# Patient Record
Sex: Female | Born: 1990 | Race: Black or African American | Hispanic: No | Marital: Single | Smoking: Never smoker
Health system: Southern US, Community
[De-identification: ages and names within clinical notes are randomized; demographics above are authoritative.]

## PROBLEM LIST (undated history)

## (undated) ENCOUNTER — Ambulatory Visit (HOSPITAL_COMMUNITY): Admission: EM | Payer: Self-pay | Source: Home / Self Care

---
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
## 2013-04-29 DIAGNOSIS — Z3202 Encounter for pregnancy test, result negative: Secondary | ICD-10-CM | POA: Insufficient documentation

## 2013-04-29 DIAGNOSIS — Z79899 Other long term (current) drug therapy: Secondary | ICD-10-CM | POA: Insufficient documentation

## 2013-04-29 DIAGNOSIS — R1011 Right upper quadrant pain: Secondary | ICD-10-CM | POA: Insufficient documentation

## 2013-04-29 NOTE — ED Notes (Signed)
Right lower abd pain with vomiting and diarrhea. Pt also states she has had a cold and at times feels sob

## 2013-04-30 ENCOUNTER — Encounter (HOSPITAL_COMMUNITY): Payer: Self-pay | Admitting: Emergency Medicine

## 2013-04-30 ENCOUNTER — Ambulatory Visit (HOSPITAL_COMMUNITY)
Admit: 2013-04-30 | Discharge: 2013-04-30 | Disposition: A | Discharge status: Home / Self Care | Payer: BC Managed Care – PPO | Attending: Emergency Medicine | Admitting: Emergency Medicine

## 2013-04-30 ENCOUNTER — Emergency Department (HOSPITAL_COMMUNITY)
Admission: EM | Admit: 2013-04-30 | Discharge: 2013-04-30 | Disposition: A | Discharge status: Home / Self Care | Payer: BC Managed Care – PPO | Attending: Emergency Medicine | Admitting: Emergency Medicine

## 2013-04-30 ENCOUNTER — Other Ambulatory Visit (HOSPITAL_COMMUNITY): Payer: Self-pay | Admitting: Emergency Medicine

## 2013-04-30 DIAGNOSIS — R1011 Right upper quadrant pain: Secondary | ICD-10-CM | POA: Insufficient documentation

## 2013-04-30 LAB — COMPREHENSIVE METABOLIC PANEL
ALBUMIN: 3.9 g/dL (ref 3.5–5.2)
ALT: 14 U/L (ref 0–35)
AST: 18 U/L (ref 0–37)
Alkaline Phosphatase: 82 U/L (ref 39–117)
BILIRUBIN TOTAL: 0.3 mg/dL (ref 0.3–1.2)
BUN: 10 mg/dL (ref 6–23)
CHLORIDE: 100 meq/L (ref 96–112)
CO2: 26 meq/L (ref 19–32)
Calcium: 9.3 mg/dL (ref 8.4–10.5)
Creatinine, Ser: 0.63 mg/dL (ref 0.50–1.10)
GFR calc Af Amer: >90 mL/min (ref >90)
Glucose, Bld: 98 mg/dL (ref 70–99)
POTASSIUM: 4.3 meq/L (ref 3.7–5.3)
Sodium: 136 mEq/L — ABNORMAL LOW (ref 137–147)
Total Protein: 8.2 g/dL (ref 6.0–8.3)

## 2013-04-30 LAB — CBC WITH DIFFERENTIAL/PLATELET
BASOS PCT: 0 % (ref 0–1)
Basophils Absolute: 0 10*3/uL (ref 0.0–0.1)
EOS PCT: 1 % (ref 0–5)
Eosinophils Absolute: 0.1 10*3/uL (ref 0.0–0.7)
HCT: 39 % (ref 36.0–46.0)
HEMOGLOBIN: 13.3 g/dL (ref 12.0–15.0)
LYMPHS ABS: 2.4 10*3/uL (ref 0.7–4.0)
LYMPHS PCT: 27 % (ref 12–46)
MCH: 29.9 pg (ref 26.0–34.0)
MCHC: 34.1 g/dL (ref 30.0–36.0)
MCV: 87.6 fL (ref 78.0–100.0)
Monocytes Absolute: 0.6 10*3/uL (ref 0.1–1.0)
Monocytes Relative: 7 % (ref 3–12)
Neutro Abs: 6 10*3/uL (ref 1.7–7.7)
Neutrophils Relative %: 66 % (ref 43–77)
PLATELETS: 279 10*3/uL (ref 150–400)
RBC: 4.45 MIL/uL (ref 3.87–5.11)
RDW: 12 % (ref 11.5–15.5)
WBC: 9 10*3/uL (ref 4.0–10.5)

## 2013-04-30 LAB — URINALYSIS, ROUTINE W REFLEX MICROSCOPIC
Bilirubin Urine: NEGATIVE
GLUCOSE, UA: NEGATIVE mg/dL
Hgb urine dipstick: NEGATIVE
Ketones, ur: NEGATIVE mg/dL
LEUKOCYTES UA: NEGATIVE
NITRITE: NEGATIVE
Protein, ur: NEGATIVE mg/dL
SPECIFIC GRAVITY, URINE: 1.015 (ref 1.005–1.030)
UROBILINOGEN UA: 1 mg/dL (ref 0.0–1.0)
pH: 8.5 — ABNORMAL HIGH (ref 5.0–8.0)

## 2013-04-30 LAB — PREGNANCY, URINE: Preg Test, Ur: NEGATIVE

## 2013-04-30 LAB — LIPASE, BLOOD: Lipase: 18 U/L (ref 11–59)

## 2013-04-30 MED ORDER — PANTOPRAZOLE SODIUM 40 MG IV SOLR
40.0000 mg | Freq: Once | INTRAVENOUS | Status: AC
  Administered 2013-04-30: 40 mg via INTRAVENOUS
  Filled 2013-04-30: qty 40

## 2013-04-30 MED ORDER — ONDANSETRON HCL 4 MG/2ML IJ SOLN
4.0000 mg | Freq: Once | INTRAMUSCULAR | Status: AC
  Administered 2013-04-30: 4 mg via INTRAVENOUS
  Filled 2013-04-30: qty 2

## 2013-04-30 MED ORDER — HYDROCODONE-ACETAMINOPHEN 5-325 MG PO TABS: 1.0000 | ORAL_TABLET | ORAL | Status: AC | PRN

## 2013-04-30 MED ORDER — SODIUM CHLORIDE 0.9 % IV SOLN
INTRAVENOUS | Status: DC
  Administered 2013-04-30: 02:00:00 via INTRAVENOUS

## 2013-04-30 MED ORDER — FENTANYL CITRATE 0.05 MG/ML IJ SOLN
100.0000 ug | Freq: Once | INTRAMUSCULAR | Status: AC
  Administered 2013-04-30: 100 ug via INTRAVENOUS
  Filled 2013-04-30: qty 2

## 2013-04-30 NOTE — Discharge Instructions (Signed)
Abdominal Pain, Adult °Many things can cause abdominal pain. Usually, abdominal pain is not caused by a disease and will improve without treatment. It can often be observed and treated at home. Your health care provider will do a physical exam and possibly order blood tests and X-rays to help determine the seriousness of your pain. However, in many cases, more time must pass before a clear cause of the pain can be found. Before that point, your health care provider may not know if you need more testing or further treatment. °HOME CARE INSTRUCTIONS  °Monitor your abdominal pain for any changes. The following actions may help to alleviate any discomfort you are experiencing: °· Only take over-the-counter or prescription medicines as directed by your health care provider. °· Do not take laxatives unless directed to do so by your health care provider. °· Try a clear liquid diet (broth, tea, or water) as directed by your health care provider. Slowly move to a bland diet as tolerated. °SEEK MEDICAL CARE IF: °· You have unexplained abdominal pain. °· You have abdominal pain associated with nausea or diarrhea. °· You have pain when you urinate or have a bowel movement. °· You experience abdominal pain that wakes you in the night. °· You have abdominal pain that is worsened or improved by eating food. °· You have abdominal pain that is worsened with eating fatty foods. °SEEK IMMEDIATE MEDICAL CARE IF:  °· Your pain does not go away within 2 hours. °· You have a fever. °· You keep throwing up (vomiting). °· Your pain is felt only in portions of the abdomen, such as the right side or the left lower portion of the abdomen. °· You pass bloody or black tarry stools. °MAKE SURE YOU: °· Understand these instructions.   °· Will watch your condition.   °· Will get help right away if you are not doing well or get worse.   °Document Released: 11/18/2004 Document Revised: 11/29/2012 Document Reviewed: 10/18/2012 °ExitCare® Patient  Information ©2014 [employer]. ° °

## 2013-04-30 NOTE — ED Provider Notes (Signed)
CSN: [id number]     Arrival date & time 04/29/13  2332 History   First MD Initiated Contact with Patient 04/30/13 0127     Chief Complaint  Patient presents with  . Abdominal Pain     (Consider location/radiation/quality/duration/timing/severity/associated sxs/prior Treatment) HPI This is a 23 year old female with right upper quadrant abdominal pain that began yesterday about noon. The onset has been gradual. The pain is now moderate to severe and is making her feel short of breath. Pain is worse with movement, palpation or deep breathing. She denies fever or chills. She denies nausea or vomiting. She did have diarrhea yesterday morning. She denies urinary symptoms, vaginal bleeding or discharge. She ate dinner at 5PM which did not change her pain.  No past medical history on file. No past surgical history on file. No family history on file. History  Substance Use Topics  . Smoking status: Never Smoker   . Smokeless tobacco: Not on file  . Alcohol Use: No   OB History   Grav Para Term Preterm Abortions TAB SAB Ect Mult Living                 Review of Systems  All other systems reviewed and are negative.   Allergies  Peanuts  Home Medications   Current Outpatient Rx  Name  Route  Sig  Dispense  Refill  . Multiple Vitamin (MULTIVITAMIN) tablet   Oral   Take 1 tablet by mouth daily.         . sertraline (ZOLOFT) 25 MG tablet   Oral   Take 25 mg by mouth daily.          BP 125/70  Pulse 102  Temp(Src) 97.9 F (36.6 C) (Oral)  Resp 18  Ht 5\' 7"  (1.702 m)  Wt 170 lb (77.111 kg)  BMI 26.62 kg/m2  SpO2 100%  LMP 04/05/2013  Physical Exam General: Well-developed, well-nourished female in no acute distress; appearance consistent with age of record HENT: normocephalic; atraumatic Eyes: pupils equal, round and reactive to light; extraocular muscles intact Neck: supple Heart: regular rate and rhythm; no murmurs, rubs or gallops Lungs: clear to auscultation  bilaterally; shallow breaths due to abdominal pain Abdomen: soft; nondistended; right upper quadrant tenderness; no masses or hepatosplenomegaly; bowel sounds present; gallbladder not visualized on bedside ultrasound Extremities: No deformity; full range of motion; pulses normal Neurologic: Awake, alert and oriented; motor function intact in all extremities and symmetric; no facial droop Skin: Warm and dry Psychiatric: Flat affect    ED Course  Procedures (including critical care time)   MDM   Nursing notes and vitals signs, including pulse oximetry, reviewed.  Summary of this visit's results, reviewed by myself:  Labs:  Results for orders placed during the hospital encounter of 04/30/13 (from the past 24 hour(s))  URINALYSIS, ROUTINE W REFLEX MICROSCOPIC     Status: Abnormal   Collection Time    04/30/13 12:13 AM      Result Value Ref Range   Color, Urine YELLOW  YELLOW   APPearance CLEAR  CLEAR   Specific Gravity, Urine 1.015  1.005 - 1.030   pH 8.5 (*) 5.0 - 8.0   Glucose, UA NEGATIVE  NEGATIVE mg/dL   Hgb urine dipstick NEGATIVE  NEGATIVE   Bilirubin Urine NEGATIVE  NEGATIVE   Ketones, ur NEGATIVE  NEGATIVE mg/dL   Protein, ur NEGATIVE  NEGATIVE mg/dL   Urobilinogen, UA 1.0  0.0 - 1.0 mg/dL   Nitrite NEGATIVE  NEGATIVE  Leukocytes, UA NEGATIVE  NEGATIVE  PREGNANCY, URINE     Status: None   Collection Time    04/30/13 12:13 AM      Result Value Ref Range   Preg Test, Ur NEGATIVE  NEGATIVE  COMPREHENSIVE METABOLIC PANEL     Status: Abnormal   Collection Time    04/30/13  2:10 AM      Result Value Ref Range   Sodium 136 (*) 137 - 147 mEq/L   Potassium 4.3  3.7 - 5.3 mEq/L   Chloride 100  96 - 112 mEq/L   CO2 26  19 - 32 mEq/L   Glucose, Bld 98  70 - 99 mg/dL   BUN 10  6 - 23 mg/dL   Creatinine, Ser 4.090.63  0.50 - 1.10 mg/dL   Calcium 9.3  8.4 - 81.110.5 mg/dL   Total Protein 8.2  6.0 - 8.3 g/dL   Albumin 3.9  3.5 - 5.2 g/dL   AST 18  0 - 37 U/L   ALT 14  0 - 35  U/L   Alkaline Phosphatase 82  39 - 117 U/L   Total Bilirubin 0.3  0.3 - 1.2 mg/dL   GFR calc non Af Amer >90  >90 mL/min   GFR calc Af Amer >90  >90 mL/min  LIPASE, BLOOD     Status: None   Collection Time    04/30/13  2:10 AM      Result Value Ref Range   Lipase 18  11 - 59 U/L  CBC WITH DIFFERENTIAL     Status: None   Collection Time    04/30/13  2:10 AM      Result Value Ref Range   WBC 9.0  4.0 - 10.5 K/uL   RBC 4.45  3.87 - 5.11 MIL/uL   Hemoglobin 13.3  12.0 - 15.0 g/dL   HCT 91.439.0  78.236.0 - 95.646.0 %   MCV 87.6  78.0 - 100.0 fL   MCH 29.9  26.0 - 34.0 pg   MCHC 34.1  30.0 - 36.0 g/dL   RDW 21.312.0  08.611.5 - 57.815.5 %   Platelets 279  150 - 400 K/uL   Neutrophils Relative % 66  43 - 77 %   Neutro Abs 6.0  1.7 - 7.7 K/uL   Lymphocytes Relative 27  12 - 46 %   Lymphs Abs 2.4  0.7 - 4.0 K/uL   Monocytes Relative 7  3 - 12 %   Monocytes Absolute 0.6  0.1 - 1.0 K/uL   Eosinophils Relative 1  0 - 5 %   Eosinophils Absolute 0.1  0.0 - 0.7 K/uL   Basophils Relative 0  0 - 1 %   Basophils Absolute 0.0  0.0 - 0.1 K/uL   3:01 AM Patient sleeping comfortably after fentanyl 100 mcg IV. When awakened she states that the fentanyl eased her pain down to an 8/10. She continues to be tender in the right upper quadrant. This is concerning for biliary colic. Her lab work is normal. We will have her return later this morning for an abdominal ultrasound.     Hanley SeamenJohn L Yessenia Maillet, MD 04/30/13 [phone]

## 2013-10-27 IMAGING — CR DG NECK SOFT TISSUE
2 series · 2 of 2 positions shown · non-contrast
Comparison: Cervical spine x-rays 08/14/2006.  No prior soft tissue
neck.

CLINICAL DATA: Allergic reaction to eating cherries.  Difficulty
swallowing.

NECK SOFT TISSUES - 1+ VIEW

[view not recorded (1 of 2)]
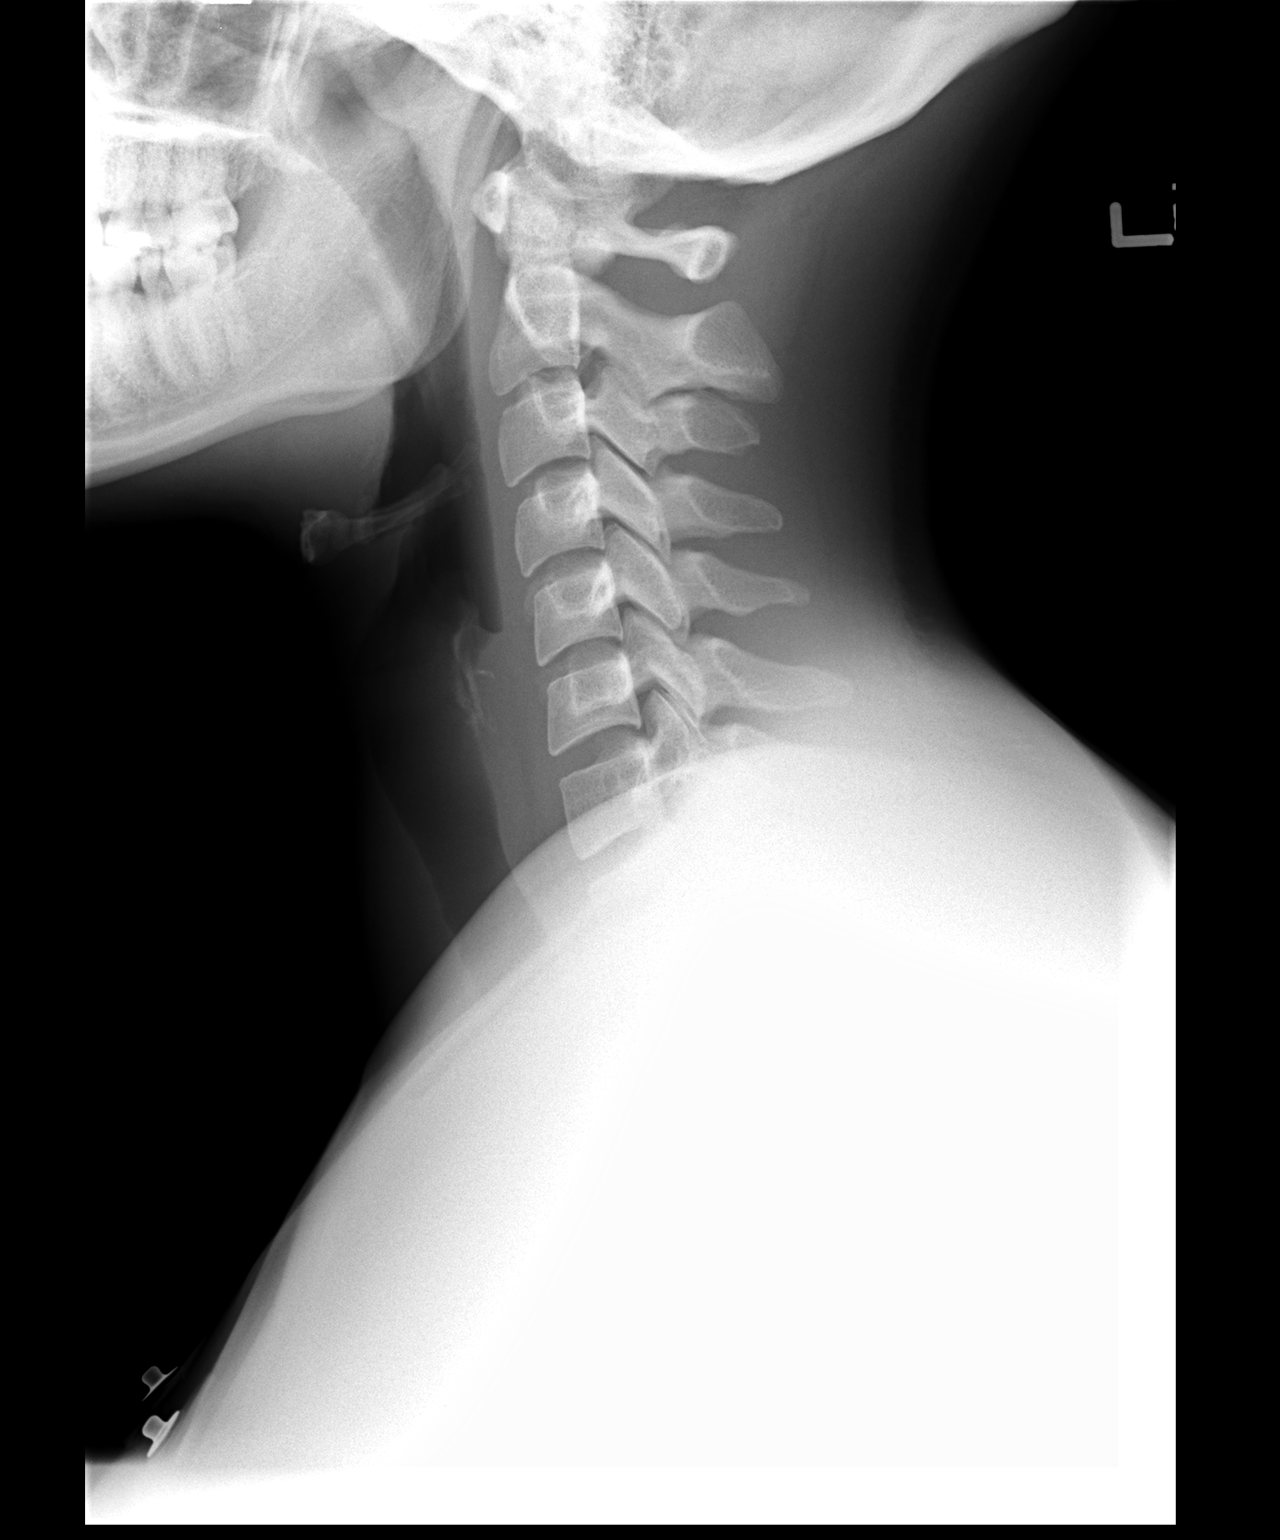

[view not recorded (2 of 2)]
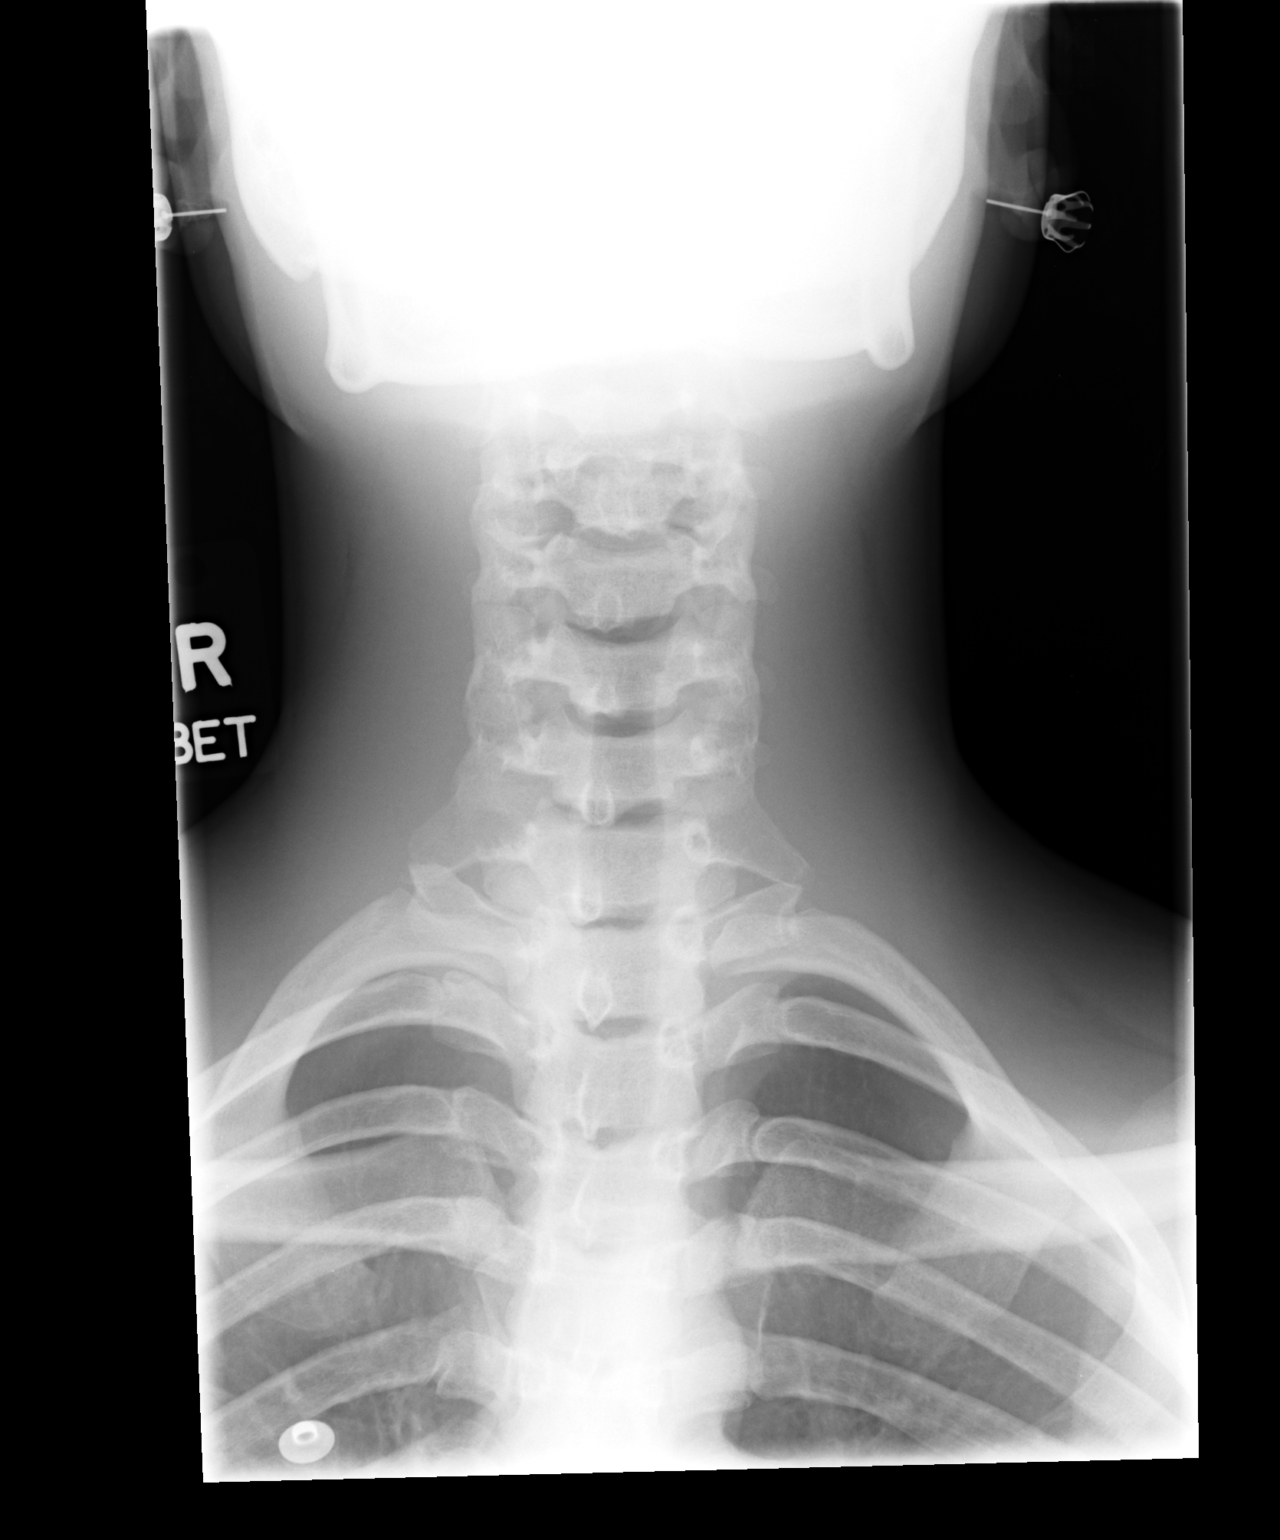

[2 of 2 positions shown; findings below may reference images not displayed]

FINDINGS: Normal epiglottis.  Normal prevertebral soft tissues.
Normal adenoidal tissue.  Visualized upper trachea normal.  No
subglottic stenosis.  Visualized cervical spine intact.
IMPRESSION: Normal examination.

## 2013-11-14 ENCOUNTER — Telehealth: Payer: Self-pay | Admitting: *Deleted

## 2013-11-14 ENCOUNTER — Encounter: Payer: Self-pay | Admitting: Women's Health

## 2013-11-14 ENCOUNTER — Other Ambulatory Visit (HOSPITAL_COMMUNITY)
Admission: RE | Admit: 2013-11-14 | Discharge: 2013-11-14 | Disposition: A | Discharge status: Home / Self Care | Payer: BC Managed Care – PPO | Source: Ambulatory Visit | Attending: Obstetrics & Gynecology | Admitting: Obstetrics & Gynecology

## 2013-11-14 ENCOUNTER — Ambulatory Visit (INDEPENDENT_AMBULATORY_CARE_PROVIDER_SITE_OTHER): Payer: BC Managed Care – PPO | Admitting: Women's Health

## 2013-11-14 VITALS — BP 116/70 | Ht 66.25 in | Wt 170.0 lb

## 2013-11-14 DIAGNOSIS — B9689 Other specified bacterial agents as the cause of diseases classified elsewhere: Secondary | ICD-10-CM

## 2013-11-14 DIAGNOSIS — Z01419 Encounter for gynecological examination (general) (routine) without abnormal findings: Secondary | ICD-10-CM

## 2013-11-14 DIAGNOSIS — Z113 Encounter for screening for infections with a predominantly sexual mode of transmission: Secondary | ICD-10-CM

## 2013-11-14 DIAGNOSIS — A499 Bacterial infection, unspecified: Secondary | ICD-10-CM

## 2013-11-14 DIAGNOSIS — N898 Other specified noninflammatory disorders of vagina: Secondary | ICD-10-CM

## 2013-11-14 DIAGNOSIS — N760 Acute vaginitis: Secondary | ICD-10-CM

## 2013-11-14 LAB — POCT WET PREP (WET MOUNT): CLUE CELLS WET PREP WHIFF POC: POSITIVE

## 2013-11-14 MED ORDER — METRONIDAZOLE 0.75 % VA GEL: 1.0000 | Freq: Every day | VAGINAL | Status: AC

## 2013-11-14 MED ORDER — METRONIDAZOLE 500 MG PO TABS: 500.0000 mg | ORAL_TABLET | Freq: Two times a day (BID) | ORAL | Status: DC

## 2013-11-14 NOTE — Progress Notes (Signed)
Patient ID: Krista Bates, female   DOB: [birth year], 23 y.o.   MRN: [id number] Subjective:   Krista Bates is a 23 y.o. G0 African American female here for a routine well-woman exam.  Patient's last menstrual period was 11/06/2013.    Current complaints: thick white malodorous d/c w/ itching x 2wks. H/O CT in May. With a new partner now.  PCP: practice in [place], can't remember doctor's name, but thinks he's leaving, so wanted to get care elsewhere       Does desire STI screening, states she had normal annual labs w/ her PCP earlier this year.  Stopped COCs 11/06/13 as she leaves for [employer] this Sunday and was directed to stop all medications, including birth control  Smokes occ Black & Mild cigar  The following portions of the patient's history were reviewed and updated as appropriate: allergies, current medications, past family history, past medical history, past social history, past surgical history and problem list.  Past Medical History History reviewed. No pertinent past medical history.  Past Surgical History History reviewed. No pertinent past surgical history.  Gynecologic History No obstetric history on file.  Patient's last menstrual period was 11/06/2013. Contraception: condoms Last Pap: never. Results were: n/a Last mammogram: never. Results were: n/a Last TCS: never  Obstetric History OB History  No data available    Current Medications Current Outpatient Prescriptions on File Prior to Visit  Medication Sig Dispense Refill  . HYDROcodone-acetaminophen (NORCO) 5-325 MG per tablet Take 1-2 tablets by mouth every 4 (four) hours as needed (for pain).  6 tablet  0  . Multiple Vitamin (MULTIVITAMIN) tablet Take 1 tablet by mouth daily.      . sertraline (ZOLOFT) 25 MG tablet Take 25 mg by mouth daily.       No current facility-administered medications on file prior to visit.    Review of Systems Patient denies any headaches, blurred vision, shortness of  breath, chest pain, abdominal pain, problems with bowel movements, urination, or intercourse.  Objective:  BP 116/70  Ht 5' 6.25" (1.683 m)  Wt 170 lb (77.111 kg)  BMI 27.22 kg/m2  LMP 11/06/2013 Physical Exam  General:  Well developed, well nourished, no acute distress. She is alert and oriented x3. Skin:  Warm and dry Neck:  Midline trachea, no thyromegaly or nodules Cardiovascular: Regular rate and rhythm, no murmur heard Lungs:  Effort normal, all lung fields clear to auscultation bilaterally Breasts:  No dominant palpable mass, retraction, or nipple discharge Abdomen:  Soft, non tender, no hepatosplenomegaly or masses Pelvic:  External genitalia is normal in appearance.  The vagina is normal in appearance. Mod amount thin white malodorous d/c. The cervix is bulbous, no CMT.  Thin prep pap is done w/ reflex HR HPV cotesting. Uterus is felt to be normal size, shape, and contour.  No adnexal masses or tenderness noted. Extremities:  No swelling or varicosities noted Psych:  She has a normal mood and affect  Results for orders placed in visit on 11/14/13 (from the past 24 hour(s))  POCT WET PREP (WET MOUNT)     Status: Abnormal   Collection Time    11/14/13  9:17 AM      Result Value Ref Range   Source Wet Prep POC vaginal     WBC, Wet Prep HPF POC many     Bacteria Wet Prep HPF POC none     BACTERIA WET PREP MORPHOLOGY POC       Clue Cells Wet Prep HPF POC  Many     Clue Cells Wet Prep Whiff POC Positive Whiff     Yeast Wet Prep HPF POC None     KOH Wet Prep POC       Trichomonas Wet Prep HPF POC none       Assessment:   Healthy well-woman exam BV Smoker  Plan:  Rx flagyl bid x 7d, no etoh GC/CT from pap, HIV, RPR, HSV2, HepB & HepC today Condoms for contraception F/U 20yr for physical, or sooner if needed Mammogram  or sooner if problems Colonoscopy  or sooner if problems  Marge Duncans CNM, [facility] 11/14/2013 9:17 AM

## 2013-11-14 NOTE — Telephone Encounter (Signed)
Returned pt's call. Rx'd metrogel x 5nights for bv instead of flagyl po d/t her Musician stating she can not be on oral meds.  Cheral Marker, CNM, [facility] 11/14/2013 12:10 PM

## 2013-11-14 NOTE — Telephone Encounter (Signed)
Pt called back to see if there is any alternative medication or injection she can get instead of the medication that was Rxd this morning, her recruiter told her that they did not want her on any oral medication when she leaves for the [employer].  Please advise.

## 2013-11-14 NOTE — Patient Instructions (Signed)
Bacterial Vaginosis Bacterial vaginosis is a vaginal infection that occurs when the normal balance of bacteria in the vagina is disrupted. It results from an overgrowth of certain bacteria. This is the most common vaginal infection in women of childbearing age. Treatment is important to prevent complications, especially in pregnant women, as it can cause a premature delivery. CAUSES  Bacterial vaginosis is caused by an increase in harmful bacteria that are normally present in smaller amounts in the vagina. Several different kinds of bacteria can cause bacterial vaginosis. However, the reason that the condition develops is not fully understood. RISK FACTORS Certain activities or behaviors can put you at an increased risk of developing bacterial vaginosis, including:  Having a new sex partner or multiple sex partners.  Douching.  Using an intrauterine device (IUD) for contraception. Women do not get bacterial vaginosis from toilet seats, bedding, swimming pools, or contact with objects around them. SIGNS AND SYMPTOMS  Some women with bacterial vaginosis have no signs or symptoms. Common symptoms include:  Grey vaginal discharge.  A fishlike odor with discharge, especially after sexual intercourse.  Itching or burning of the vagina and vulva.  Burning or pain with urination. DIAGNOSIS  Your health care provider will take a medical history and examine the vagina for signs of bacterial vaginosis. A sample of vaginal fluid may be taken. Your health care provider will look at this sample under a microscope to check for bacteria and abnormal cells. A vaginal pH test may also be done.  TREATMENT  Bacterial vaginosis may be treated with antibiotic medicines. These may be given in the form of a pill or a vaginal cream. A second round of antibiotics may be prescribed if the condition comes back after treatment.  HOME CARE INSTRUCTIONS   Only take over-the-counter or prescription medicines as  directed by your health care provider.  If antibiotic medicine was prescribed, take it as directed. Make sure you finish it even if you start to feel better.  Do not have sex until treatment is completed.  Tell all sexual partners that you have a vaginal infection. They should see their health care provider and be treated if they have problems, such as a mild rash or itching.  Practice safe sex by using condoms and only having one sex partner. SEEK MEDICAL CARE IF:   Your symptoms are not improving after 3 days of treatment.  You have increased discharge or pain.  You have a fever. MAKE SURE YOU:   Understand these instructions.  Will watch your condition.  Will get help right away if you are not doing well or get worse. FOR MORE INFORMATION  Centers for Disease Control and Prevention, Division of STD Prevention: www.cdc.gov/std American Sexual Health Association (ASHA): www.ashastd.org  Document Released: 02/08/2005 Document Revised: 11/29/2012 Document Reviewed: 09/20/2012 ExitCare Patient Information 2015 [employer]. This information is not intended to replace advice given to you by your health care provider. Make sure you discuss any questions you have with your health care provider.  

## 2013-11-15 LAB — HIV ANTIBODY (ROUTINE TESTING W REFLEX): HIV 1&2 Ab, 4th Generation: NONREACTIVE

## 2013-11-15 LAB — HEPATITIS C ANTIBODY: HCV Ab: NEGATIVE

## 2013-11-15 LAB — RPR

## 2013-11-15 LAB — HEPATITIS B SURFACE ANTIGEN: HEP B S AG: NEGATIVE

## 2013-11-16 LAB — CYTOLOGY - PAP

## 2013-11-16 LAB — HSV 2 ANTIBODY, IGG

## 2015-08-13 IMAGING — US US ABDOMEN LIMITED
1 series · 14 of 25 positions shown · non-contrast
Comparison: None

CLINICAL DATA: Right upper quadrant pain

EXAM:
LIMITED RIGHT UPPER QUADRANT ULTRASOUND
TECHNIQUE: Limited ultrasound examination was performed to evaluate the
gallbladder, bile ducts, and liver.

[Series 1: us abdomen limited · 0.20mm/px · 14 of 50 slices shown]
[im 1/50]
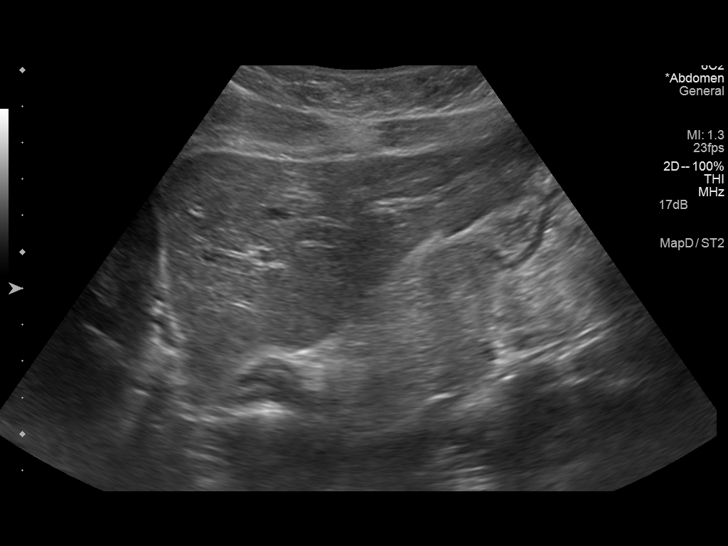
[im 5/50]
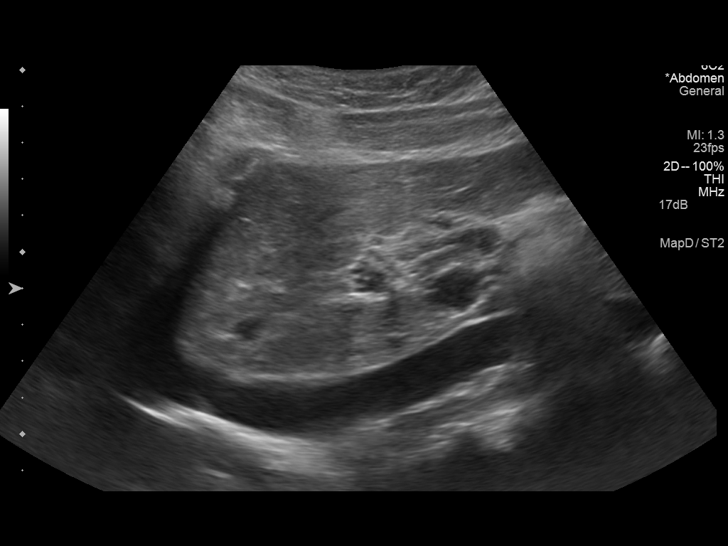
[im 9/50]
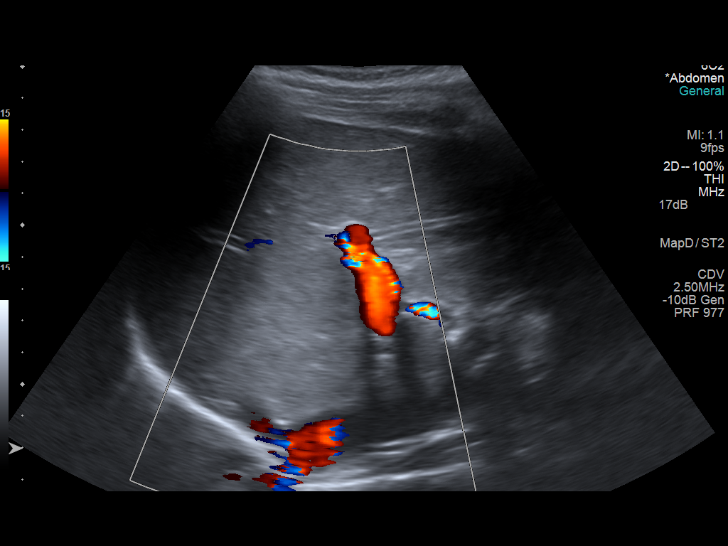
[im 13/50]
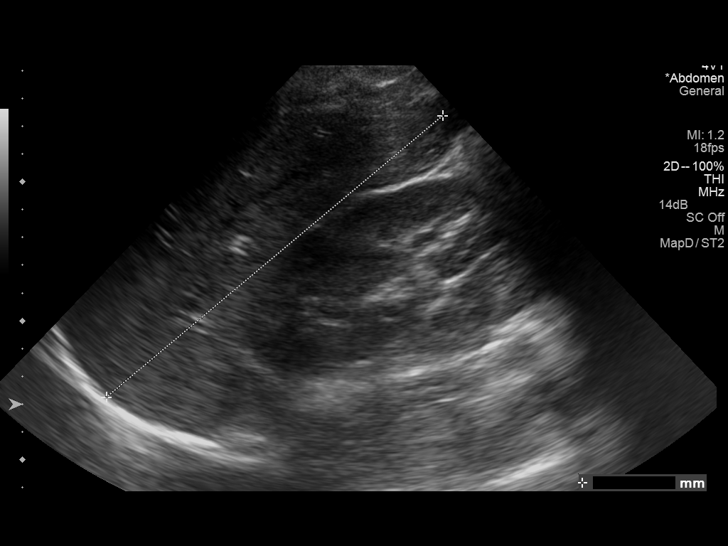
[im 17/50]
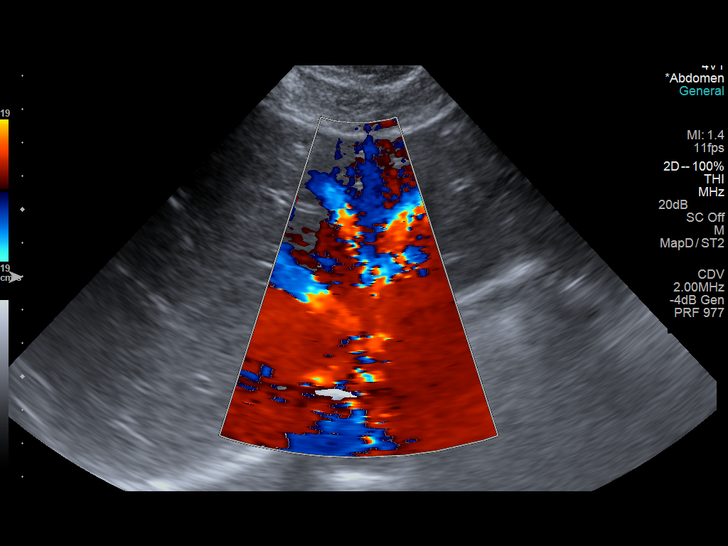
[im 19/50]
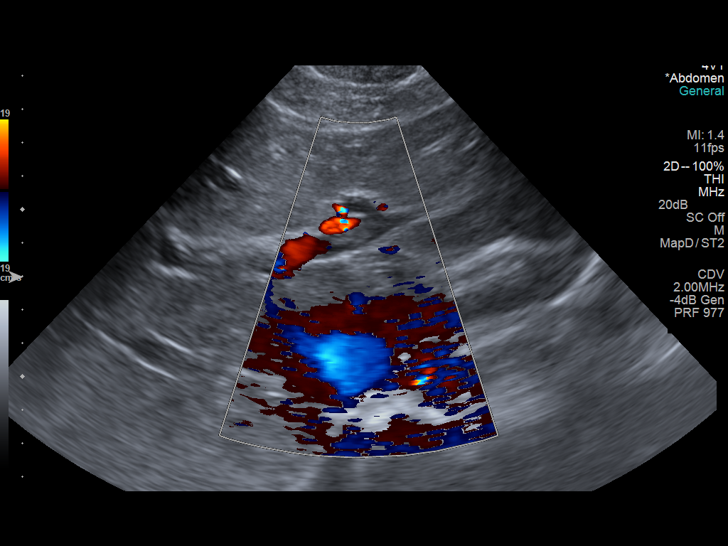
[im 23/50]
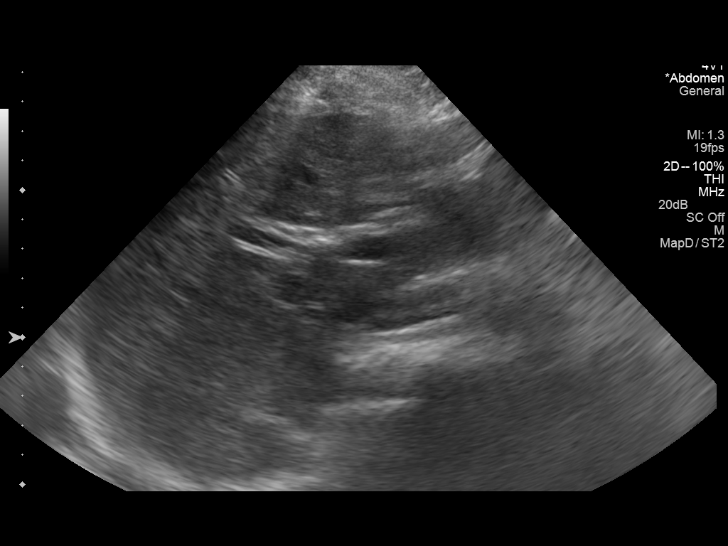
[im 27/50]
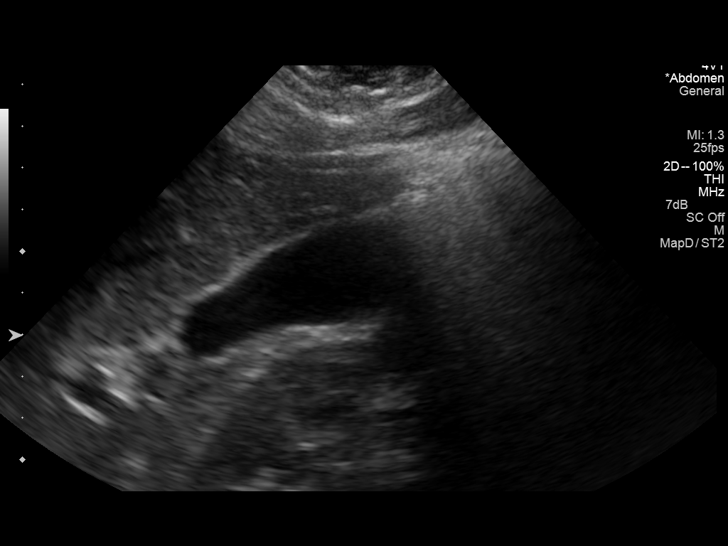
[im 31/50]
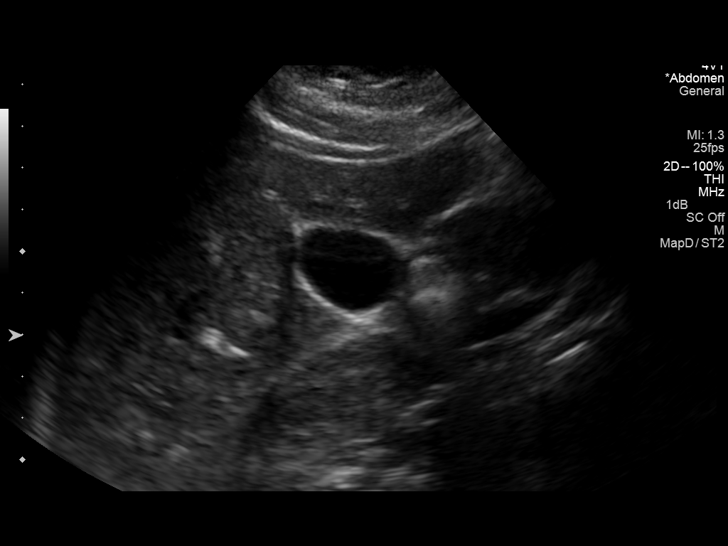
[im 33/50]
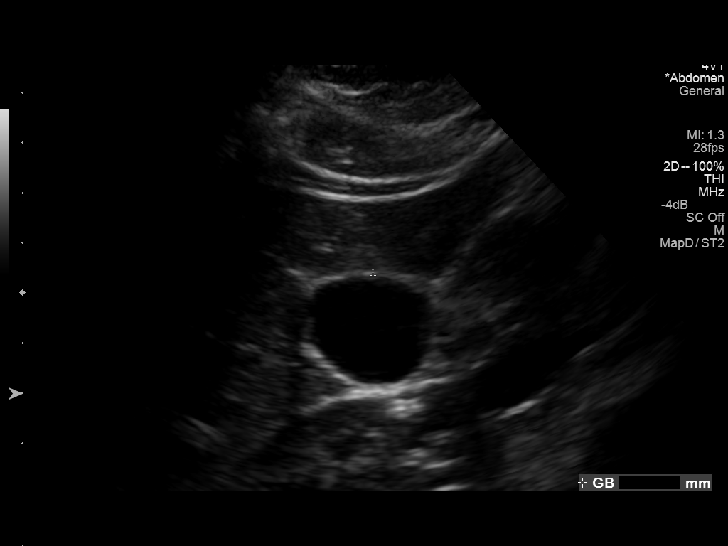
[im 37/50]
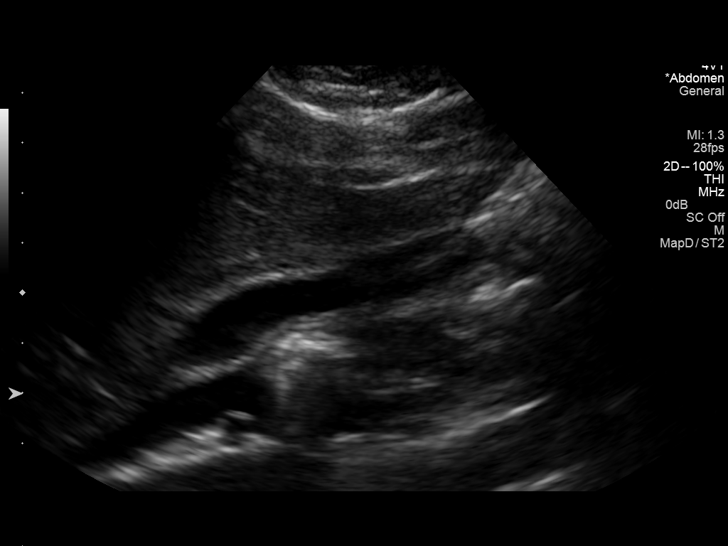
[im 41/50]
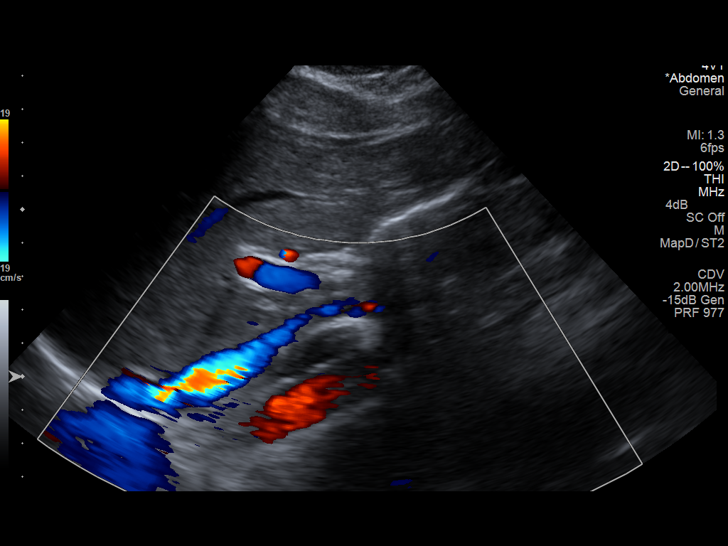
[im 45/50]
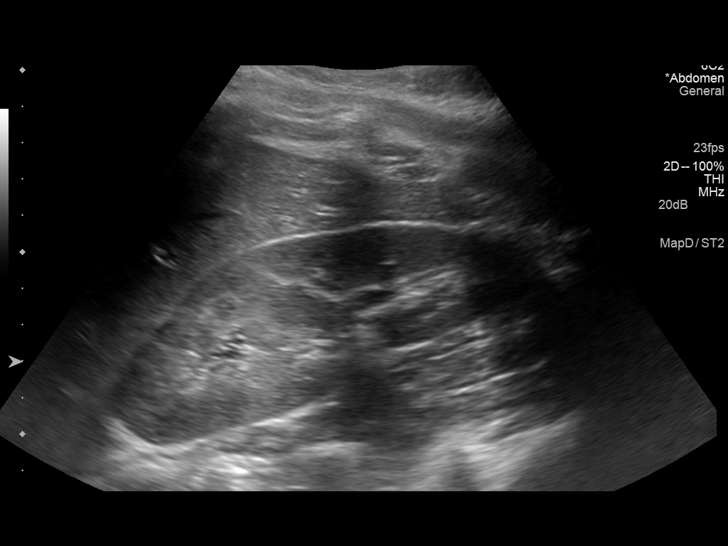
[im 50/50]
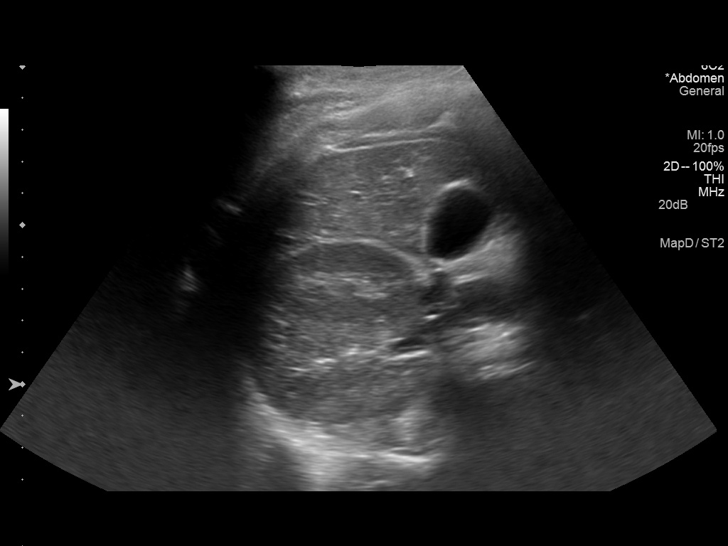

[14 of 25 positions shown; findings below may reference images not displayed]

FINDINGS: No gallstones, pericholecystic fluid, or wall thickening.

The common bile duct is normal in caliber.

The liver is normal in echogenicity and without focal mass.
IMPRESSION: No acute pathology involving the gallbladder and liver.

## 2024-03-17 ENCOUNTER — Ambulatory Visit (HOSPITAL_COMMUNITY): Payer: Self-pay
